# Patient Record
Sex: Female | Born: 1960 | Race: White | Hispanic: No | Marital: Married
Health system: Southern US, Community
[De-identification: ages and names within clinical notes are randomized; demographics above are authoritative.]

---

## 1997-12-11 ENCOUNTER — Encounter: Admission: RE | Admit: 1997-12-11 | Discharge: 1998-03-11 | Payer: Self-pay | Admitting: Anesthesiology

## 1998-03-11 ENCOUNTER — Encounter: Admission: RE | Admit: 1998-03-11 | Discharge: 1998-06-09 | Payer: Self-pay | Admitting: Anesthesiology

## 1998-03-19 ENCOUNTER — Encounter: Admission: RE | Admit: 1998-03-19 | Discharge: 1998-06-17 | Payer: Self-pay | Admitting: Family Medicine

## 1998-07-22 ENCOUNTER — Encounter: Admission: RE | Admit: 1998-07-22 | Discharge: 1998-09-21 | Payer: Self-pay | Admitting: Anesthesiology

## 1999-06-30 ENCOUNTER — Encounter: Admission: RE | Admit: 1999-06-30 | Discharge: 1999-06-30 | Payer: Self-pay | Admitting: Neurosurgery

## 1999-06-30 ENCOUNTER — Encounter: Payer: Self-pay | Admitting: Neurosurgery

## 1999-07-17 ENCOUNTER — Encounter: Payer: Self-pay | Admitting: Neurosurgery

## 1999-07-17 ENCOUNTER — Ambulatory Visit (HOSPITAL_COMMUNITY): Admission: RE | Admit: 1999-07-17 | Discharge: 1999-07-17 | Payer: Self-pay | Admitting: Neurosurgery

## 2000-04-26 ENCOUNTER — Emergency Department (HOSPITAL_COMMUNITY): Admission: EM | Admit: 2000-04-26 | Discharge: 2000-04-26 | Payer: Self-pay | Admitting: Internal Medicine

## 2000-04-26 ENCOUNTER — Encounter: Payer: Self-pay | Admitting: Internal Medicine

## 2003-09-30 ENCOUNTER — Emergency Department (HOSPITAL_COMMUNITY): Admission: EM | Admit: 2003-09-30 | Discharge: 2003-09-30 | Payer: Self-pay | Admitting: Emergency Medicine

## 2003-10-07 ENCOUNTER — Ambulatory Visit (HOSPITAL_COMMUNITY): Admission: RE | Admit: 2003-10-07 | Discharge: 2003-10-07 | Payer: Self-pay | Admitting: Family Medicine

## 2003-10-14 ENCOUNTER — Ambulatory Visit (HOSPITAL_COMMUNITY): Admission: RE | Admit: 2003-10-14 | Discharge: 2003-10-14 | Payer: Self-pay | Admitting: Gastroenterology

## 2003-10-14 ENCOUNTER — Encounter (INDEPENDENT_AMBULATORY_CARE_PROVIDER_SITE_OTHER): Payer: Self-pay | Admitting: Specialist

## 2003-10-19 ENCOUNTER — Ambulatory Visit (HOSPITAL_COMMUNITY): Admission: RE | Admit: 2003-10-19 | Discharge: 2003-10-19 | Payer: Self-pay | Admitting: General Surgery

## 2003-11-13 ENCOUNTER — Ambulatory Visit (HOSPITAL_BASED_OUTPATIENT_CLINIC_OR_DEPARTMENT_OTHER): Admission: RE | Admit: 2003-11-13 | Discharge: 2003-11-13 | Payer: Self-pay | Admitting: Orthopedic Surgery

## 2004-08-05 ENCOUNTER — Ambulatory Visit: Payer: Self-pay | Admitting: Gastroenterology

## 2007-05-14 ENCOUNTER — Ambulatory Visit (HOSPITAL_BASED_OUTPATIENT_CLINIC_OR_DEPARTMENT_OTHER): Admission: RE | Admit: 2007-05-14 | Discharge: 2007-05-14 | Payer: Self-pay | Admitting: Orthopedic Surgery

## 2007-05-14 ENCOUNTER — Encounter (INDEPENDENT_AMBULATORY_CARE_PROVIDER_SITE_OTHER): Payer: Self-pay | Admitting: Orthopedic Surgery

## 2010-11-01 NOTE — Op Note (Signed)
NAME:  Cindy Friedman, Cindy Friedman              ACCOUNT NO.:  [id number]   MEDICAL RECORD NO.:  [id number]          PATIENT TYPE:  AMB   LOCATION:  DSC                          FACILITY:  [facility]   PHYSICIAN:  Cindee Salt, M.D.       DATE OF BIRTH:  04-13-1961   DATE OF PROCEDURE:  05/14/2007  DATE OF DISCHARGE:                               OPERATIVE REPORT   PREOPERATIVE DIAGNOSIS:  Lateral epicondylitis, left elbow.   POSTOPERATIVE DIAGNOSIS:  Lateral epicondylitis, left elbow.   OPERATION:  Reconstruction extensor origin, left elbow.   SURGEON:  Cindee Salt, M.D.   ASSISTANT:  Carolyne Fiscal, R.N.   ANESTHESIA:  General.   HISTORY:  The patient is a 50 year old female with a history of lateral  epicondylitis.  This has not responded to conservative treatment in  time.  She is admitted now for reconstruction.  She is aware of risks  and complications including infection, recurrence, injury to arteries,  nerves, tendons, complete relief of symptoms, dystrophy.  She has  elected to proceed to have this done, questions encouraged and answered.  In the preoperative area, the patient is seen, extremity marked by both  the patient and surgeon.  Antibiotic given.   DESCRIPTION OF PROCEDURE:  The patient is brought to the operating room  where a general anesthetic was carried out without difficulty.  She was  prepped using DuraPrep, supine position, left arm free.  The limb was  exsanguinated with an Esmarch bandage, tourniquet placed high and the  arm was inflated to 250 mmHg. A straight incision was made over the  lateral epicondyle, left elbow, carried down through the subcutaneous  tissue.  Bleeders were electrocauterized.  Dissection was carried down  to the extensor origin and incision was made longitudinally in the  extensor origin. The fibers of the extensor carpi radialis longus were  then split. The area of insertion of the extensor carpi radialis brevis  was immediately encountered.  Significant  degenerative changes were  present with no tissue that looked collagen fibers.  This was thoroughly  debrided.  The area was then debrided with a rongeur. The specimen was  sent to pathology.  The area was irrigated and an osteotome was used to  roughen the bone.  Drill holes were placed. A 3-0 Tycron suture was then  used to repair the extensor origin back down to the area of the bone  closing the entire defect in the origin.  This was tied over the  posterior aspect of the epicondyle.  The wound was again irrigated, the  subcutaneous tissue was closed with interrupted 4-0 Vicryl, the skin  with interrupted 4-0 Vicryl  Rapide sutures.  A sterile compressive dressing, long-arm splint with  the wrist dorsiflexed and elbow flexed was applied.  The patient  tolerated the procedure well and was taken to the recovery room for  observation in satisfactory condition.  She will be discharged home to  return to the [facility] of [place] in 1 week on Vicodin.           ______________________________  Cindee Salt, M.D.  GK/MEDQ  D:  05/14/2007  T:  05/14/2007  Job:  [id number]   cc:   Selinda Flavin
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
## 2011-03-28 LAB — POCT HEMOGLOBIN-HEMACUE
Hemoglobin: 12.6
Operator id: 123881

## 2013-09-30 ENCOUNTER — Other Ambulatory Visit: Payer: Self-pay | Admitting: Neurosurgery

## 2013-09-30 DIAGNOSIS — M4800 Spinal stenosis, site unspecified: Secondary | ICD-10-CM

## 2013-10-06 ENCOUNTER — Ambulatory Visit
Admission: RE | Admit: 2013-10-06 | Discharge: 2013-10-06 | Disposition: A | Discharge status: Home / Self Care | Payer: 59 | Source: Ambulatory Visit | Attending: Neurosurgery | Admitting: Neurosurgery

## 2013-10-06 VITALS — BP 140/77 | HR 82

## 2013-10-06 DIAGNOSIS — M4800 Spinal stenosis, site unspecified: Secondary | ICD-10-CM

## 2013-10-06 MED ORDER — DIAZEPAM 5 MG PO TABS
10.0000 mg | ORAL_TABLET | Freq: Once | ORAL | Status: AC
  Administered 2013-10-06: 10 mg via ORAL

## 2013-10-06 NOTE — Discharge Instructions (Signed)
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
## 2013-10-07 ENCOUNTER — Other Ambulatory Visit: Payer: 59

## 2013-10-07 ENCOUNTER — Inpatient Hospital Stay
Admission: RE | Admit: 2013-10-07 | Discharge: 2013-10-07 | Disposition: A | Discharge status: Home / Self Care | Payer: 59 | Source: Ambulatory Visit | Attending: Neurosurgery | Admitting: Neurosurgery

## 2013-10-07 NOTE — Discharge Instructions (Signed)

## 2013-10-08 ENCOUNTER — Ambulatory Visit
Admission: RE | Admit: 2013-10-08 | Discharge: 2013-10-08 | Disposition: A | Discharge status: Home / Self Care | Payer: 59 | Source: Ambulatory Visit | Attending: Neurosurgery | Admitting: Neurosurgery

## 2013-10-08 VITALS — BP 99/63 | HR 67

## 2013-10-08 DIAGNOSIS — M79604 Pain in right leg: Secondary | ICD-10-CM

## 2013-10-08 DIAGNOSIS — M79605 Pain in left leg: Principal | ICD-10-CM

## 2013-10-08 MED ORDER — DIAZEPAM 5 MG PO TABS
10.0000 mg | ORAL_TABLET | Freq: Once | ORAL | Status: AC
  Administered 2013-10-08: 10 mg via ORAL

## 2013-10-08 MED ORDER — IOHEXOL 180 MG/ML  SOLN
18.0000 mL | Freq: Once | INTRAMUSCULAR | Status: AC | PRN
  Administered 2013-10-08: 18 mL via INTRATHECAL

## 2013-10-08 NOTE — Discharge Instructions (Signed)

## 2013-10-09 ENCOUNTER — Other Ambulatory Visit: Payer: 59

## 2013-10-13 ENCOUNTER — Ambulatory Visit
Admission: RE | Admit: 2013-10-13 | Discharge: 2013-10-13 | Disposition: A | Discharge status: Home / Self Care | Payer: 59 | Source: Ambulatory Visit | Attending: Neurosurgery | Admitting: Neurosurgery

## 2013-10-13 ENCOUNTER — Other Ambulatory Visit: Payer: Self-pay | Admitting: Neurosurgery

## 2013-10-13 ENCOUNTER — Other Ambulatory Visit (HOSPITAL_COMMUNITY): Payer: Self-pay | Admitting: Radiology

## 2013-10-13 DIAGNOSIS — M545 Low back pain, unspecified: Secondary | ICD-10-CM

## 2013-10-13 DIAGNOSIS — M25471 Effusion, right ankle: Secondary | ICD-10-CM

## 2013-10-13 DIAGNOSIS — M25472 Effusion, left ankle: Secondary | ICD-10-CM

## 2014-10-16 IMAGING — CT CT L SPINE W/ CM
4 of 10 series · 12 of 33 positions shown, 14 images · non-contrast
Comparison: MRI 09/17/2013

CLINICAL DATA: Bilateral hip pain and leg pain.  Left leg weakness.
TECHNIQUE: Contiguous axial images were obtained through the Lumbar spine after
the intrathecal infusion of infusion. Coronal and sagittal
reconstructions were obtained of the axial image sets.

[Series 2: l spine bone · axial · 0.27mm/px · z∈[-14,+56]mm · 2 of 84 slices shown, 3 images]
[im 28/84  soft-tissue]
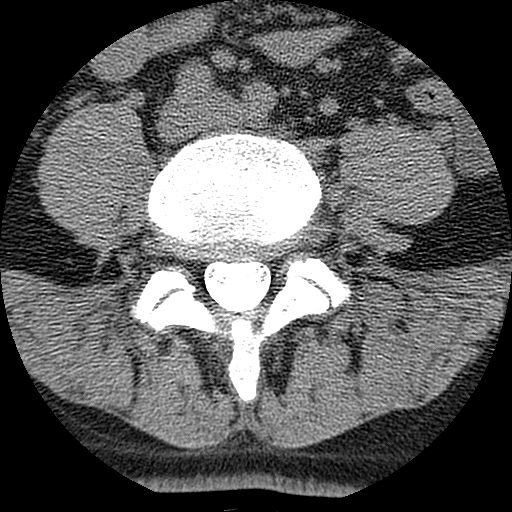
[im 28/84  bone]
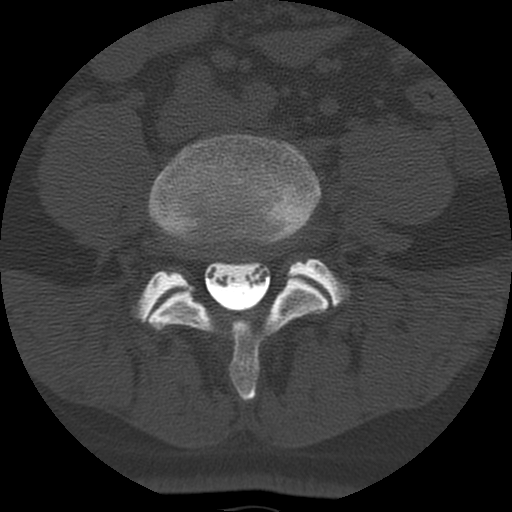
[im 56/84  bone]
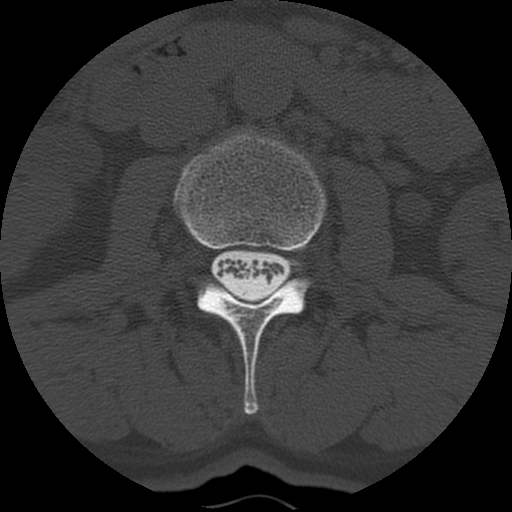

[Series 3: l spine soft · axial · 0.27mm/px · z∈[-14,+56]mm · 2 of 84 slices shown]
[im 28/84  soft-tissue]
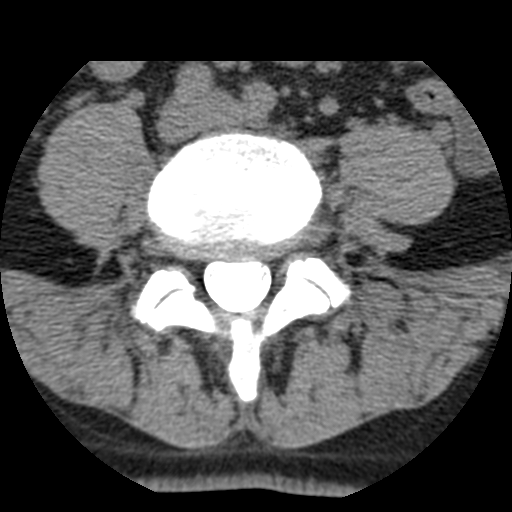
[im 56/84  soft-tissue]
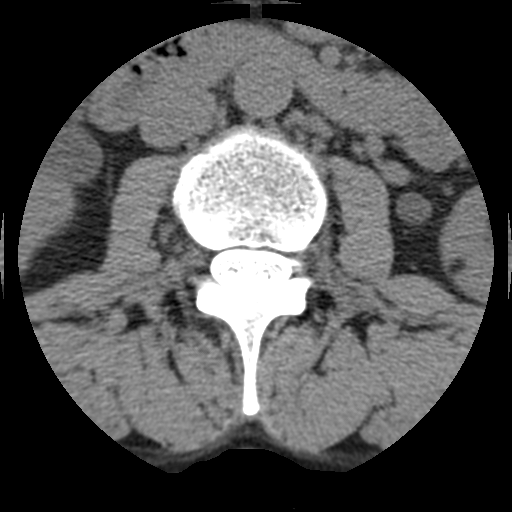

[Series 400: sagittal · sagittal · 0.42mm/px · 5 of 50 slices shown, 6 images]
[im 17/50  bone]
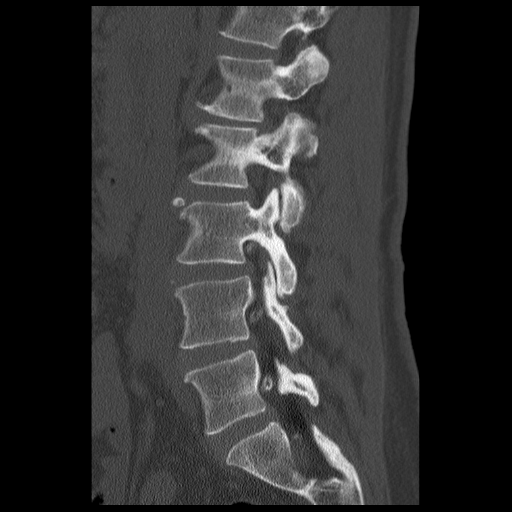
[im 21/50  bone]
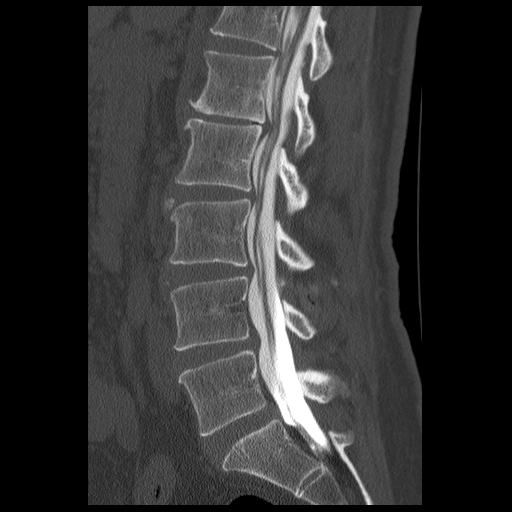
[im 25/50  soft-tissue]
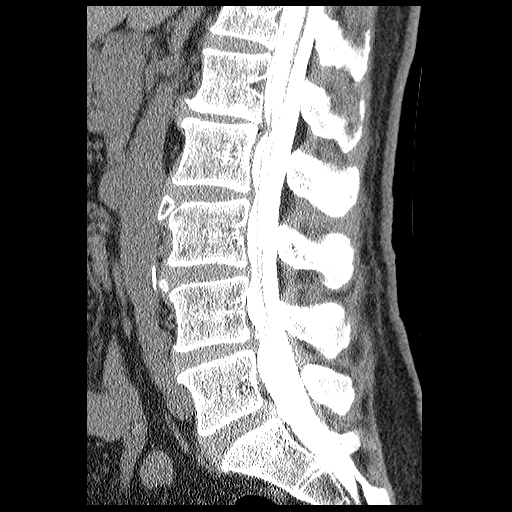
[im 25/50  bone]
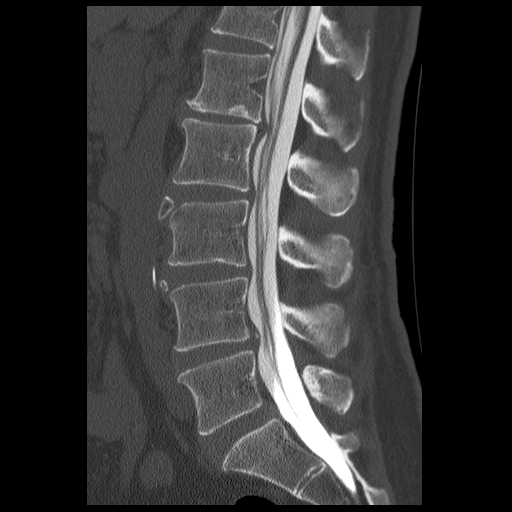
[im 29/50  bone]
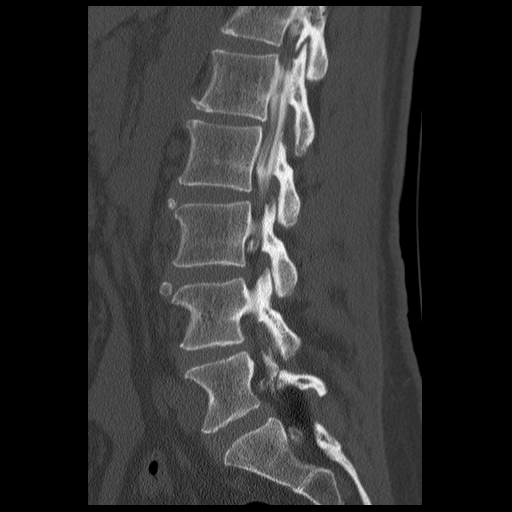
[im 33/50  bone]
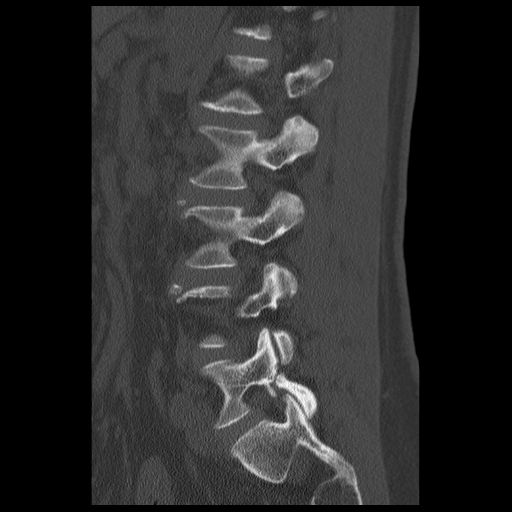

[Series 401: coronal · coronal · 0.42mm/px · 3 of 55 slices shown]
[im 11/55  bone]
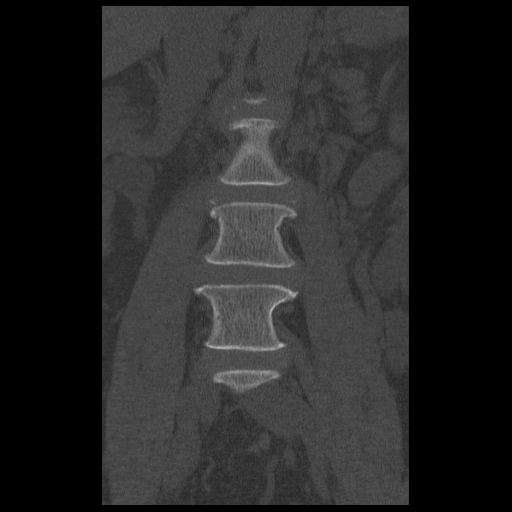
[im 22/55  bone]
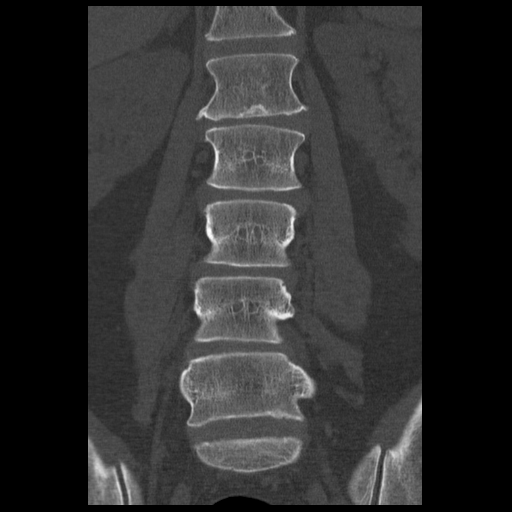
[im 33/55  bone]
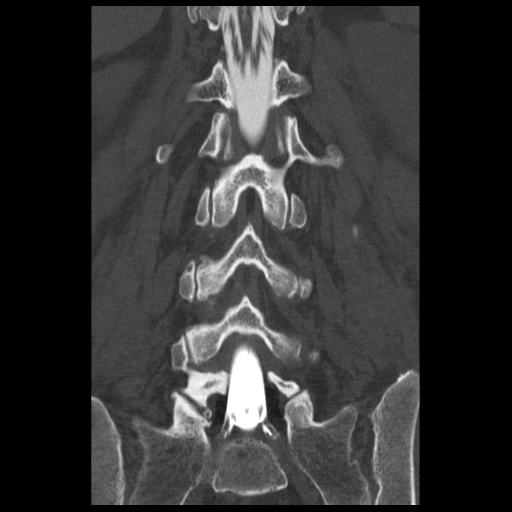

[12 of 33 positions shown; findings below may reference images not displayed]

EXAM:
LUMBAR MYELOGRAM

FLUOROSCOPY TIME:  0 min 49 seconds

PROCEDURE:
After thorough discussion of risks and benefits of the procedure
including bleeding, infection, injury to nerves, blood vessels,
adjacent structures as well as headache and CSF leak, written and
oral informed consent was obtained. Consent was obtained by Dr. Moolman
Seyedsina. Time out form was completed.

Patient was positioned prone on the fluoroscopy table. Local
anesthesia was provided with 1% lidocaine without epinephrine after
prepped and draped in the usual sterile fashion. Puncture was
performed at L3-4 using a 3 1/2 inch 22-gauge spinal needle via
right paramedian approach. Using a single pass through the dura, the
needle was placed within the thecal sac, with return of clear CSF.
15 mL of Pmnipaque-AT6 was injected into the thecal sac, with normal
opacification of the nerve roots and cauda equina consistent with
free flow within the subarachnoid space.

I personally performed the lumbar puncture and administered the
intrathecal contrast. I also personally performed acquisition of the
myelogram images.
FINDINGS: LUMBAR MYELOGRAM FINDINGS:

There are anterior extradural defects at every level throughout the
lumbar spine. This is most prominent at the L4-5 level. However,
there is no central canal stenosis and no distinct lateral recess
nerve root compression. There is no antero or retrolisthesis in the
supine position, but with standing, there is 2-3 mm of
retrolisthesis at L1-2. This does not appear to change with flexion
extension. No other flexion-extension motion.

CT LUMBAR MYELOGRAM FINDINGS:

T12-L1:  Normal interspace.  Conus tip upper L1.

L1-2: Disc degeneration with loss of disc height more on the right
side. Bulging of the disc more prominent towards the right. No
compressive narrowing of the canal or foramina.

L2-3:  Mild bulging of the disc.  No compressive stenosis.

L3-4: Mild bulging of the disc. No compressive stenosis. Mild facet
degeneration on the right.

L4-5: Shallow broad-based protrusion of disc material with slight
indentation of the thecal sac. Peripheral annular calcification. No
central canal stenosis. There is mild foraminal narrowing on the
left without definite compression of the exiting L4 nerve root.

L5-S1: There is a unilateral pars defect on the left at L5. 1 mm of
anterolisthesis. Minimal bulging of the disc. No canal or foraminal
stenosis.

Sacroiliac joints show mild osteoarthritis.
IMPRESSION: LUMBAR MYELOGRAM IMPRESSION:

Anterior extradural defects throughout the lumbar region without
central canal stenosis or definite focal nerve root compression by a
myelography. Two or 3 mm retrolisthesis is noted at the L1-2 level
with standing, but this does not change with flexion or extension.

CT LUMBAR MYELOGRAM IMPRESSION:

L1-2: Degenerative disc disease more pronounced on the right.
Bulging of the disc more towards the right. No apparent neural
compression.

L2-3:  Noncompressive disc bulge.

L3-4: Noncompressive disc bulge. Mild facet degeneration on the
right.

L4-5: Shallow disc protrusion with some annular calcification. Mild
foraminal narrowing on the left that would have some potential to
irritate the exiting left L4 nerve root.

L5-S1: Unilateral pars defect on the left. 1 mm of anterolisthesis.
No apparent compressive stenosis.

Sacroiliac osteoarthritis, mild

## 2014-10-16 IMAGING — RF DG MYELOGRAPHY LUMBAR INJ LUMBOSACRAL
12 series · 12 of 12 positions shown · non-contrast
Comparison: MRI 09/17/2013

CLINICAL DATA: Bilateral hip pain and leg pain.  Left leg weakness.
TECHNIQUE: Contiguous axial images were obtained through the Lumbar spine after
the intrathecal infusion of infusion. Coronal and sagittal
reconstructions were obtained of the axial image sets.

[Series 1: (hospital) · 1 of 1 slices shown]
[im 1/1]
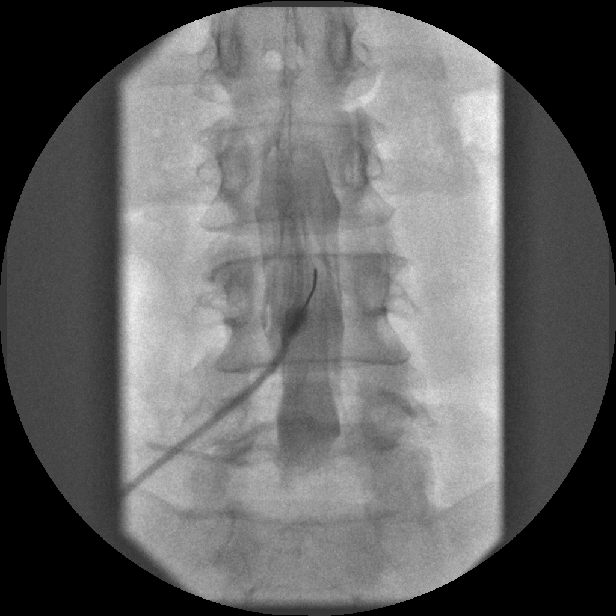

[Series 2: myelogram  white · 1 of 1 slices shown (1 of 8)]
[im 1/1]
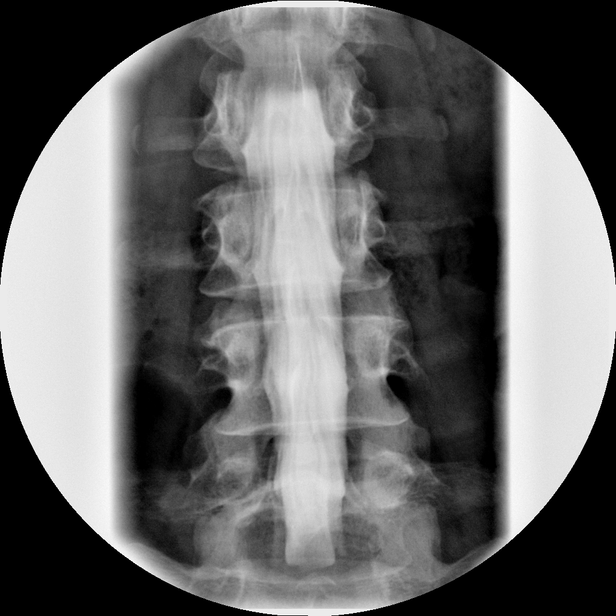

[Series 3: myelogram  white · 1 of 1 slices shown (2 of 8)]
[im 1/1]
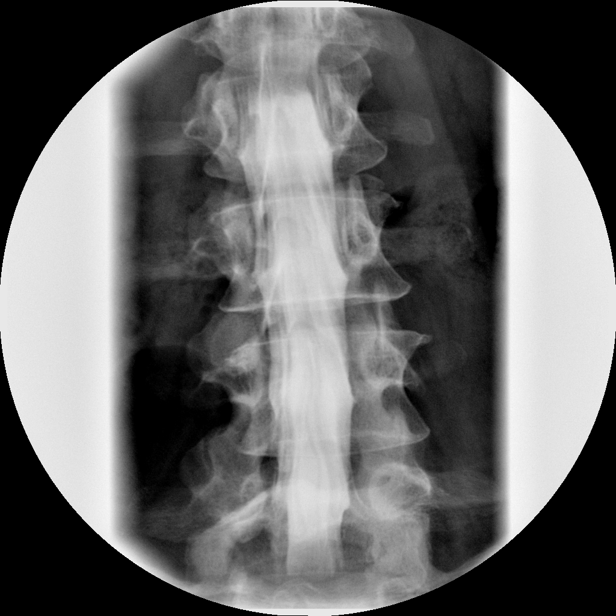

[Series 4: myelogram  white · 1 of 1 slices shown (3 of 8)]
[im 1/1]
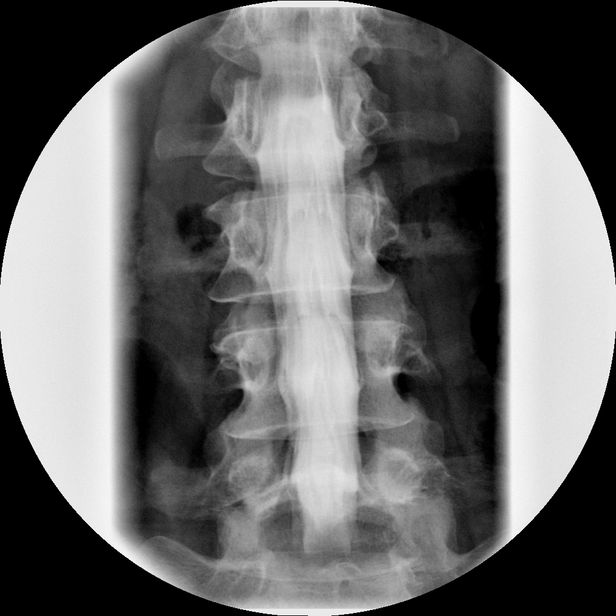

[Series 6: myelogram  white · 1 of 1 slices shown (4 of 8)]
[im 1/1]
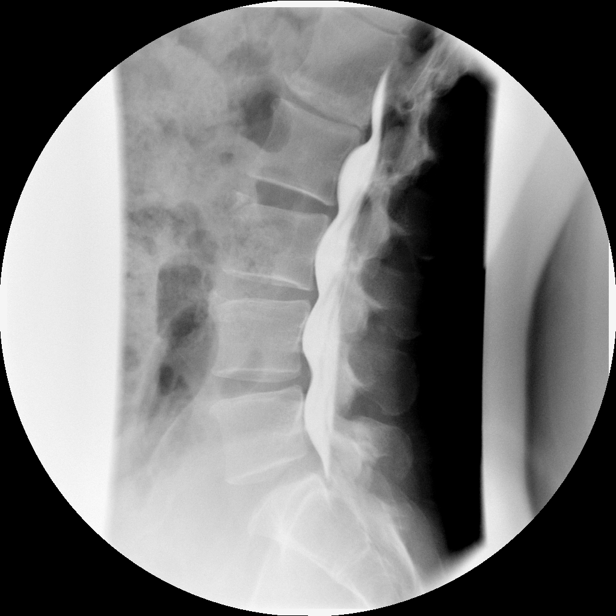

[Series 7: myelogram  white · 1 of 1 slices shown (5 of 8)]
[im 1/1]
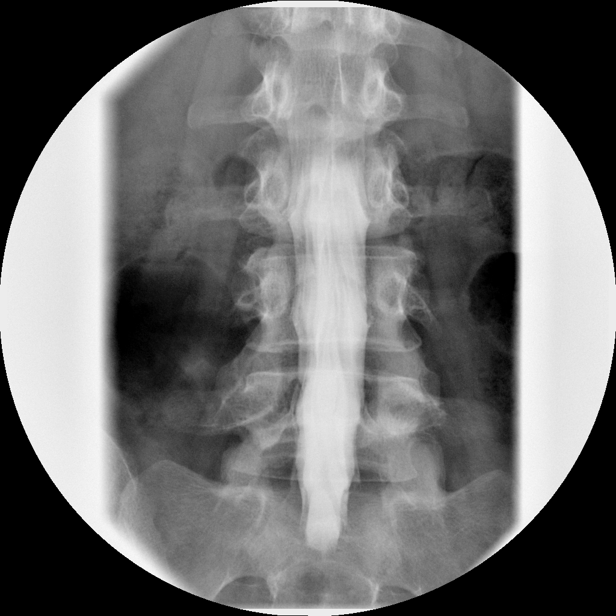

[Series 8: myelogram  white · 1 of 1 slices shown (6 of 8)]
[im 1/1]
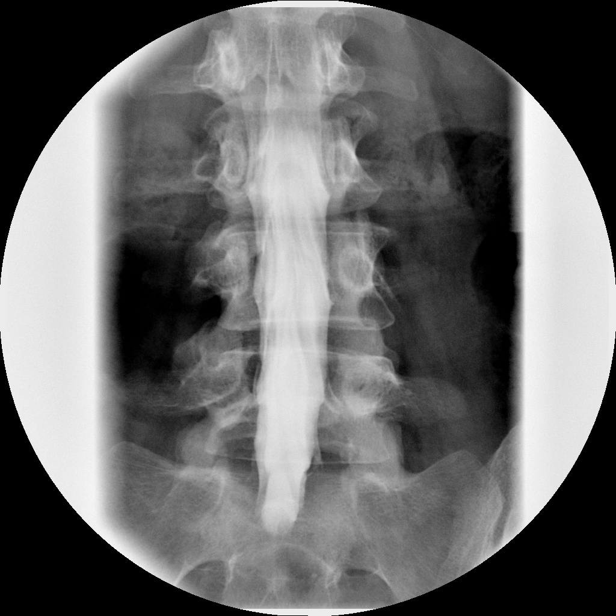

[Series 9: myelogram  white · 1 of 1 slices shown (7 of 8)]
[im 1/1]
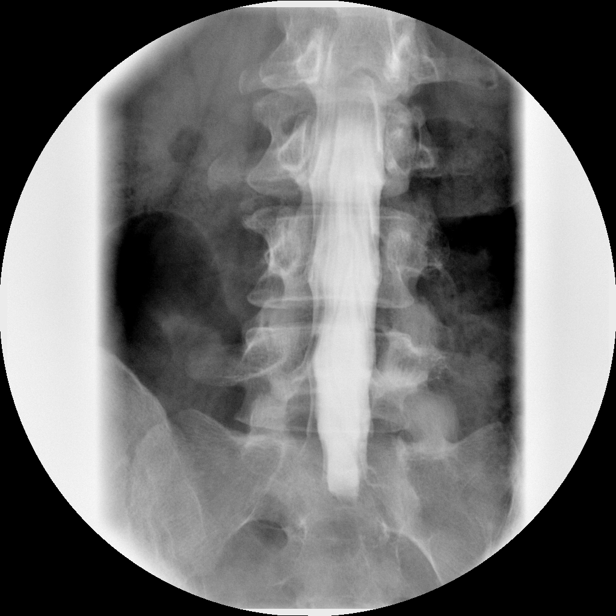

[Series 10: myelogram  white · 1 of 1 slices shown (8 of 8)]
[im 1/1]
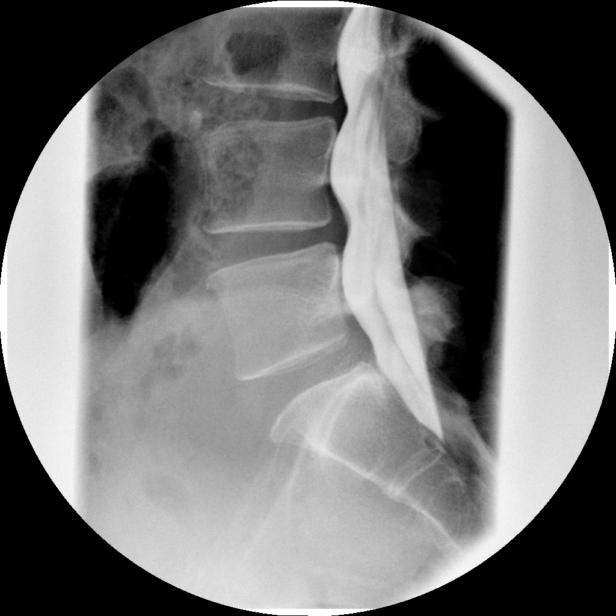

[Series 1001: view not recorded · 0.20mm/px · 1 of 1 slices shown (1 of 3)]
[im 1/1]
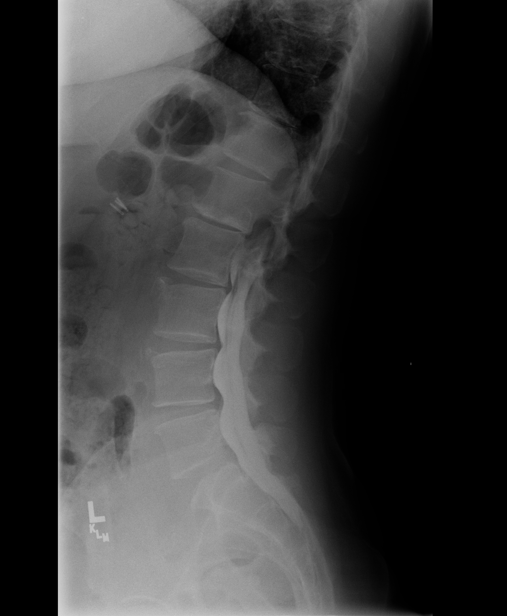

[Series 1002: view not recorded · 0.20mm/px · 1 of 1 slices shown (2 of 3)]
[im 1/1]
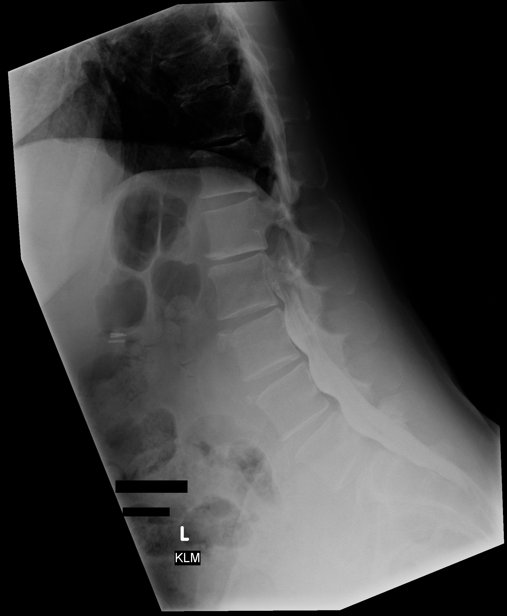

[Series 1003: view not recorded · 0.20mm/px · 1 of 1 slices shown (3 of 3)]
[im 1/1]
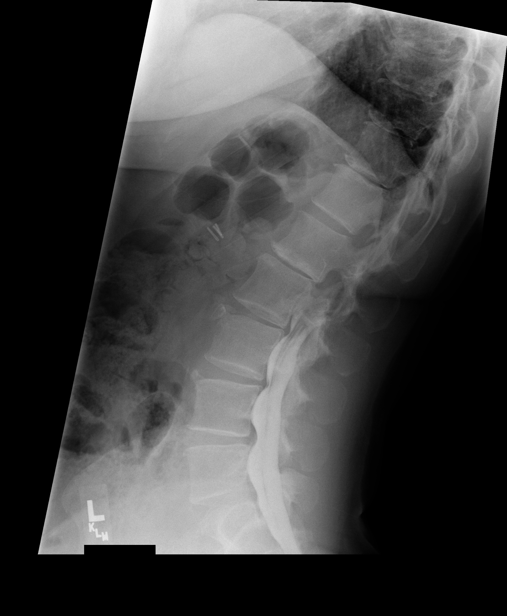

[12 of 12 positions shown; findings below may reference images not displayed]

EXAM:
LUMBAR MYELOGRAM

FLUOROSCOPY TIME:  0 min 49 seconds

PROCEDURE:
After thorough discussion of risks and benefits of the procedure
including bleeding, infection, injury to nerves, blood vessels,
adjacent structures as well as headache and CSF leak, written and
oral informed consent was obtained. Consent was obtained by Dr. Moolman
Seyedsina. Time out form was completed.

Patient was positioned prone on the fluoroscopy table. Local
anesthesia was provided with 1% lidocaine without epinephrine after
prepped and draped in the usual sterile fashion. Puncture was
performed at L3-4 using a 3 1/2 inch 22-gauge spinal needle via
right paramedian approach. Using a single pass through the dura, the
needle was placed within the thecal sac, with return of clear CSF.
15 mL of Pmnipaque-AT6 was injected into the thecal sac, with normal
opacification of the nerve roots and cauda equina consistent with
free flow within the subarachnoid space.

I personally performed the lumbar puncture and administered the
intrathecal contrast. I also personally performed acquisition of the
myelogram images.
FINDINGS: LUMBAR MYELOGRAM FINDINGS:

There are anterior extradural defects at every level throughout the
lumbar spine. This is most prominent at the L4-5 level. However,
there is no central canal stenosis and no distinct lateral recess
nerve root compression. There is no antero or retrolisthesis in the
supine position, but with standing, there is 2-3 mm of
retrolisthesis at L1-2. This does not appear to change with flexion
extension. No other flexion-extension motion.

CT LUMBAR MYELOGRAM FINDINGS:

T12-L1:  Normal interspace.  Conus tip upper L1.

L1-2: Disc degeneration with loss of disc height more on the right
side. Bulging of the disc more prominent towards the right. No
compressive narrowing of the canal or foramina.

L2-3:  Mild bulging of the disc.  No compressive stenosis.

L3-4: Mild bulging of the disc. No compressive stenosis. Mild facet
degeneration on the right.

L4-5: Shallow broad-based protrusion of disc material with slight
indentation of the thecal sac. Peripheral annular calcification. No
central canal stenosis. There is mild foraminal narrowing on the
left without definite compression of the exiting L4 nerve root.

L5-S1: There is a unilateral pars defect on the left at L5. 1 mm of
anterolisthesis. Minimal bulging of the disc. No canal or foraminal
stenosis.

Sacroiliac joints show mild osteoarthritis.
IMPRESSION: LUMBAR MYELOGRAM IMPRESSION:

Anterior extradural defects throughout the lumbar region without
central canal stenosis or definite focal nerve root compression by a
myelography. Two or 3 mm retrolisthesis is noted at the L1-2 level
with standing, but this does not change with flexion or extension.

CT LUMBAR MYELOGRAM IMPRESSION:

L1-2: Degenerative disc disease more pronounced on the right.
Bulging of the disc more towards the right. No apparent neural
compression.

L2-3:  Noncompressive disc bulge.

L3-4: Noncompressive disc bulge. Mild facet degeneration on the
right.

L4-5: Shallow disc protrusion with some annular calcification. Mild
foraminal narrowing on the left that would have some potential to
irritate the exiting left L4 nerve root.

L5-S1: Unilateral pars defect on the left. 1 mm of anterolisthesis.
No apparent compressive stenosis.

Sacroiliac osteoarthritis, mild

## 2016-10-17 HISTORY — PX: BREAST BIOPSY: SHX20

## 2016-10-23 ENCOUNTER — Other Ambulatory Visit: Payer: Self-pay | Admitting: Family Medicine

## 2016-10-23 DIAGNOSIS — R5381 Other malaise: Secondary | ICD-10-CM

## 2016-10-27 ENCOUNTER — Other Ambulatory Visit: Payer: Self-pay | Admitting: Family Medicine

## 2016-10-27 DIAGNOSIS — N6002 Solitary cyst of left breast: Secondary | ICD-10-CM

## 2016-11-01 ENCOUNTER — Ambulatory Visit
Admission: RE | Admit: 2016-11-01 | Discharge: 2016-11-01 | Disposition: A | Discharge status: Home / Self Care | Payer: Commercial Managed Care - HMO | Source: Ambulatory Visit | Attending: Family Medicine | Admitting: Family Medicine

## 2016-11-01 DIAGNOSIS — N6002 Solitary cyst of left breast: Secondary | ICD-10-CM

## 2017-12-04 ENCOUNTER — Other Ambulatory Visit: Payer: Self-pay | Admitting: Family Medicine

## 2017-12-04 DIAGNOSIS — Z1231 Encounter for screening mammogram for malignant neoplasm of breast: Secondary | ICD-10-CM

## 2017-12-24 ENCOUNTER — Ambulatory Visit
Admission: RE | Admit: 2017-12-24 | Discharge: 2017-12-24 | Disposition: A | Discharge status: Home / Self Care | Payer: BLUE CROSS/BLUE SHIELD | Source: Ambulatory Visit | Attending: Family Medicine | Admitting: Family Medicine

## 2017-12-24 ENCOUNTER — Encounter: Payer: Self-pay | Admitting: Radiology

## 2017-12-24 DIAGNOSIS — Z1231 Encounter for screening mammogram for malignant neoplasm of breast: Secondary | ICD-10-CM
